# Patient Record
Sex: Male | Born: 1990 | Race: White | Hispanic: No | Marital: Married | Smoking: Former smoker
Health system: Southern US, Community
[De-identification: ages and names within clinical notes are randomized; demographics above are authoritative.]

---

## 2015-09-16 ENCOUNTER — Emergency Department
Admission: EM | Admit: 2015-09-16 | Discharge: 2015-09-16 | Disposition: A | Discharge status: Home / Self Care | Payer: Self-pay | Attending: Emergency Medicine | Admitting: Emergency Medicine

## 2015-09-16 ENCOUNTER — Encounter: Payer: Self-pay | Admitting: *Deleted

## 2015-09-16 DIAGNOSIS — Z87891 Personal history of nicotine dependence: Secondary | ICD-10-CM | POA: Insufficient documentation

## 2015-09-16 DIAGNOSIS — A880 Enteroviral exanthematous fever [Boston exanthem]: Secondary | ICD-10-CM | POA: Insufficient documentation

## 2015-09-16 DIAGNOSIS — H938X3 Other specified disorders of ear, bilateral: Secondary | ICD-10-CM | POA: Insufficient documentation

## 2015-09-16 DIAGNOSIS — R42 Dizziness and giddiness: Secondary | ICD-10-CM | POA: Insufficient documentation

## 2015-09-16 LAB — BASIC METABOLIC PANEL
ANION GAP: 6 (ref 5–15)
BUN: 19 mg/dL (ref 6–20)
CALCIUM: 9.3 mg/dL (ref 8.9–10.3)
CHLORIDE: 101 mmol/L (ref 101–111)
CO2: 31 mmol/L (ref 22–32)
CREATININE: 0.91 mg/dL (ref 0.61–1.24)
GFR calc Af Amer: >60 mL/min (ref >60)
GFR calc non Af Amer: >60 mL/min (ref >60)
GLUCOSE: 116 mg/dL — AB (ref 65–99)
Potassium: 4.4 mmol/L (ref 3.5–5.1)
Sodium: 138 mmol/L (ref 135–145)

## 2015-09-16 LAB — URINALYSIS COMPLETE WITH MICROSCOPIC (ARMC ONLY)
BILIRUBIN URINE: NEGATIVE
Bacteria, UA: NONE SEEN
Glucose, UA: NEGATIVE mg/dL
Hgb urine dipstick: NEGATIVE
Leukocytes, UA: NEGATIVE
Nitrite: NEGATIVE
PH: 5 (ref 5.0–8.0)
PROTEIN: NEGATIVE mg/dL
Specific Gravity, Urine: 1.03 (ref 1.005–1.030)

## 2015-09-16 LAB — CBC
HCT: 44.9 % (ref 40.0–52.0)
Hemoglobin: 14.6 g/dL (ref 13.0–18.0)
MCH: 28.2 pg (ref 26.0–34.0)
MCHC: 32.4 g/dL (ref 32.0–36.0)
MCV: 86.8 fL (ref 80.0–100.0)
PLATELETS: 236 10*3/uL (ref 150–440)
RBC: 5.17 MIL/uL (ref 4.40–5.90)
RDW: 13.7 % (ref 11.5–14.5)
WBC: 11 10*3/uL — ABNORMAL HIGH (ref 3.8–10.6)

## 2015-09-16 MED ORDER — MECLIZINE HCL 25 MG PO TABS
ORAL_TABLET | ORAL | Status: AC
  Filled 2015-09-16: qty 1

## 2015-09-16 MED ORDER — MECLIZINE HCL 25 MG PO TABS
25.0000 mg | ORAL_TABLET | Freq: Three times a day (TID) | ORAL | Status: AC | PRN
Start: 2015-09-16 — End: ?

## 2015-09-16 MED ORDER — ONDANSETRON 4 MG PO TBDP: 4.0000 mg | ORAL_TABLET | Freq: Three times a day (TID) | ORAL | Status: AC | PRN

## 2015-09-16 MED ORDER — MECLIZINE HCL 25 MG PO TABS
25.0000 mg | ORAL_TABLET | Freq: Once | ORAL | Status: AC
  Administered 2015-09-16: 25 mg via ORAL

## 2015-09-16 NOTE — ED Notes (Signed)
Pt states he is experiencing intermittent dizziness when lying on his back. Pt states sudden onset this morning. Pt states he feels as if the room is spinning. Pt states dizziness persists for less time and intensity when he is sitting and not dizzy at all when standing or walking. Pt denies n/v at this time. Pt neurologically intact w/o unilateral weakness.

## 2015-09-16 NOTE — Discharge Instructions (Signed)
1. You may take medicines as needed for dizziness and nausea (meclizine/Zofran #20). 2. Return to the ER for worsening symptoms, persistent vomiting, difficulty breathing or other concerns.  Dizziness Dizziness is a common problem. It is a feeling of unsteadiness or light-headedness. You may feel like you are about to faint. Dizziness can lead to injury if you stumble or fall. Anyone can become dizzy, but dizziness is more common in older adults. This condition can be caused by a number of things, including medicines, dehydration, or illness. HOME CARE INSTRUCTIONS Taking these steps may help with your condition: Eating and Drinking  Drink enough fluid to keep your urine clear or pale yellow. This helps to keep you from becoming dehydrated. Try to drink more clear fluids, such as water.  Do not drink alcohol.  Limit your caffeine intake if directed by your health care provider.  Limit your salt intake if directed by your health care provider. Activity  Avoid making quick movements.  Rise slowly from chairs and steady yourself until you feel okay.  In the morning, first sit up on the side of the bed. When you feel okay, stand slowly while you hold onto something until you know that your balance is fine.  Move your legs often if you need to stand in one place for a long time. Tighten and relax your muscles in your legs while you are standing.  Do not drive or operate heavy machinery if you feel dizzy.  Avoid bending down if you feel dizzy. Place items in your home so that they are easy for you to reach without leaning over. Lifestyle  Do not use any tobacco products, including cigarettes, chewing tobacco, or electronic cigarettes. If you need help quitting, ask your health care provider.  Try to reduce your stress level, such as with yoga or meditation. Talk with your health care provider if you need help. General Instructions  Watch your dizziness for any changes.  Take medicines  only as directed by your health care provider. Talk with your health care provider if you think that your dizziness is caused by a medicine that you are taking.  Tell a friend or a family member that you are feeling dizzy. If he or she notices any changes in your behavior, have this person call your health care provider.  Keep all follow-up visits as directed by your health care provider. This is important. SEEK MEDICAL CARE IF:  Your dizziness does not go away.  Your dizziness or light-headedness gets worse.  You feel nauseous.  You have reduced hearing.  You have new symptoms.  You are unsteady on your feet or you feel like the room is spinning. SEEK IMMEDIATE MEDICAL CARE IF:  You vomit or have diarrhea and are unable to eat or drink anything.  You have problems talking, walking, swallowing, or using your arms, hands, or legs.  You feel generally weak.  You are not thinking clearly or you have trouble forming sentences. It may take a friend or family member to notice this.  You have chest pain, abdominal pain, shortness of breath, or sweating.  Your vision changes.  You notice any bleeding.  You have a headache.  You have neck pain or a stiff neck.  You have a fever.   This information is not intended to replace advice given to you by your health care provider. Make sure you discuss any questions you have with your health care provider.   Document Released: 03/24/2001 Document Revised: 02/12/2015  Document Reviewed: 09/24/2014 °Elsevier Interactive Patient Education ©2016 Elsevier Inc. ° °

## 2015-09-16 NOTE — ED Provider Notes (Signed)
[facility] Emergency Department Provider Note  ____________________________________________  Time seen: Approximately 2:08 AM  I have reviewed the triage vital signs and the nursing notes.   HISTORY  Chief Complaint Dizziness    HPI Bary RichardJoshua Arquette is a 24 y.o. male who presents to the ED from home with a chief complaint of dizziness. Patient states he awoke with dizziness yesterday morning. Describes sensation as room spinning. Symptoms worse with head movement. Denies associated symptoms of nausea or vomiting. Denies headache, neck pain, fever, chills, chest pain, shortness of breath, abdominal pain, weakness. Presents tonight because he tried to lay flat to go to sleep and dizziness became worse.Denies recent travel or trauma.   Past medical history None   There are no active problems to display for this patient.   History reviewed. No pertinent past surgical history.  Current Outpatient Rx  Name  Route  Sig  Dispense  Refill  . meclizine (ANTIVERT) 25 MG tablet   Oral   Take 1 tablet (25 mg total) by mouth 3 (three) times daily as needed for dizziness or nausea.   20 tablet   0   . ondansetron (ZOFRAN ODT) 4 MG disintegrating tablet   Oral   Take 1 tablet (4 mg total) by mouth every 8 (eight) hours as needed for nausea or vomiting.   20 tablet   0     Allergies Amoxicillin and Penicillins  History reviewed. No pertinent family history.  Social History Social History  Substance Use Topics  . Smoking status: Former Smoker    Types: Cigarettes  . Smokeless tobacco: Never Used  . Alcohol Use: No    Review of Systems Constitutional: No fever/chills Eyes: No visual changes. ENT: No sore throat. Cardiovascular: Denies chest pain. Respiratory: Denies shortness of breath. Gastrointestinal: No abdominal pain.  No nausea, no vomiting.  No diarrhea.  No constipation. Genitourinary: Negative for dysuria. Musculoskeletal: Negative for  back pain. Skin: Negative for rash. Neurological: Positive for dizziness. Negative for headaches, focal weakness or numbness.  10-point ROS otherwise negative.  ____________________________________________   PHYSICAL EXAM:  VITAL SIGNS: ED Triage Vitals  Enc Vitals Group     BP 09/16/15 0045 132/68 mmHg     Pulse Rate 09/16/15 0045 74     Resp 09/16/15 0150 14     Temp 09/16/15 0045 97.5 F (36.4 C)     Temp Source 09/16/15 0045 Oral     SpO2 09/16/15 0045 99 %     Weight 09/16/15 0039 165 lb (74.844 kg)     Height 09/16/15 0039 6' (1.829 m)     Head Cir --      Peak Flow --      Pain Score --      Pain Loc --      Pain Edu? --      Excl. in GC? --     Constitutional: Alert and oriented. Well appearing and in no acute distress. Eyes: Conjunctivae are normal. PERRL. EOMI. Ears: Scant fluid behind bilateral TMs. Head: Atraumatic. Nose: No congestion/rhinnorhea. Mouth/Throat: Mucous membranes are moist.  Oropharynx non-erythematous. Neck: No stridor.  No carotid bruits. Cardiovascular: Normal rate, regular rhythm. Grossly normal heart sounds.  Good peripheral circulation. Respiratory: Normal respiratory effort.  No retractions. Lungs CTAB. Gastrointestinal: Soft and nontender. No distention. No abdominal bruits. No CVA tenderness. Musculoskeletal: No lower extremity tenderness nor edema.  No joint effusions. Neurologic:  Normal speech and language. No gross focal neurologic deficits are appreciated. No gait  instability. Skin:  Skin is warm, dry and intact. No rash noted. Psychiatric: Mood and affect are normal. Speech and behavior are normal.  ____________________________________________   LABS (all labs ordered are listed, but only abnormal results are displayed)  Labs Reviewed  BASIC METABOLIC PANEL - Abnormal; Notable for the following:    Glucose, Bld 116 (*)    All other components within normal limits  CBC - Abnormal; Notable for the following:    WBC 11.0  (*)    All other components within normal limits  URINALYSIS COMPLETEWITH MICROSCOPIC ([facility] ONLY) - Abnormal; Notable for the following:    Color, Urine YELLOW (*)    APPearance CLEAR (*)    Ketones, ur TRACE (*)    Squamous Epithelial / LPF 0-5 (*)    All other components within normal limits  CBG MONITORING, ED   ____________________________________________  EKG  ED ECG REPORT I, SUNG,JADE J, the attending physician, personally viewed and interpreted this ECG.   Date: 09/16/2015  EKG Time: 0036  Rate: 71  Rhythm: normal EKG, normal sinus rhythm  Axis: Normal  Intervals:none  ST&T Change: Nonspecific No change from prior  ____________________________________________  RADIOLOGY  None ____________________________________________   PROCEDURES  Procedure(s) performed: None  Critical Care performed: No  ____________________________________________   INITIAL IMPRESSION / ASSESSMENT AND PLAN / ED COURSE  Pertinent labs & imaging results that were available during my care of the patient were reviewed by me and considered in my medical decision making (see chart for details).  24 year old male who presents with dizziness consistent with vertigo. Meclizine given. Patient updated of laboratory results. He was feeling better after the meclizine. Strict return precautions given. Patient verbalizes understanding and agrees with plan of care. ____________________________________________   FINAL CLINICAL IMPRESSION(S) / ED DIAGNOSES  Final diagnoses:  Vertigo      Irean Hong, MD 09/16/15 [phone]

## 2015-09-16 NOTE — ED Notes (Signed)
Pt sleeping. 

## 2016-01-17 ENCOUNTER — Emergency Department
Admission: EM | Admit: 2016-01-17 | Discharge: 2016-01-17 | Disposition: A | Discharge status: Home / Self Care | Payer: No Typology Code available for payment source | Attending: Emergency Medicine | Admitting: Emergency Medicine

## 2016-01-17 ENCOUNTER — Encounter: Payer: Self-pay | Admitting: Emergency Medicine

## 2016-01-17 DIAGNOSIS — Z79899 Other long term (current) drug therapy: Secondary | ICD-10-CM | POA: Insufficient documentation

## 2016-01-17 DIAGNOSIS — Y998 Other external cause status: Secondary | ICD-10-CM | POA: Insufficient documentation

## 2016-01-17 DIAGNOSIS — S8002XA Contusion of left knee, initial encounter: Secondary | ICD-10-CM | POA: Diagnosis not present

## 2016-01-17 DIAGNOSIS — Y92410 Unspecified street and highway as the place of occurrence of the external cause: Secondary | ICD-10-CM | POA: Diagnosis not present

## 2016-01-17 DIAGNOSIS — Y9389 Activity, other specified: Secondary | ICD-10-CM | POA: Insufficient documentation

## 2016-01-17 DIAGNOSIS — Z87891 Personal history of nicotine dependence: Secondary | ICD-10-CM | POA: Insufficient documentation

## 2016-01-17 DIAGNOSIS — M25562 Pain in left knee: Secondary | ICD-10-CM | POA: Diagnosis present

## 2016-01-17 MED ORDER — CYCLOBENZAPRINE HCL 10 MG PO TABS: 10.0000 mg | ORAL_TABLET | Freq: Three times a day (TID) | ORAL | Status: AC | PRN

## 2016-01-17 MED ORDER — IBUPROFEN 800 MG PO TABS: 800.0000 mg | ORAL_TABLET | Freq: Three times a day (TID) | ORAL | Status: DC | PRN
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
## 2016-01-17 NOTE — Discharge Instructions (Signed)
Cryotherapy °Cryotherapy means treatment with cold. Ice or gel packs can be used to reduce both pain and swelling. Ice is the most helpful within the first 24 to 48 hours after an injury or flare-up from overusing a muscle or joint. Sprains, strains, spasms, burning pain, shooting pain, and aches can all be eased with ice. Ice can also be used when recovering from surgery. Ice is effective, has very few side effects, and is safe for most people to use. °PRECAUTIONS  °Ice is not a safe treatment option for people with: °· Raynaud phenomenon. This is a condition affecting small blood vessels in the extremities. Exposure to cold may cause your problems to return. °· Cold hypersensitivity. There are many forms of cold hypersensitivity, including: °· Cold urticaria. Red, itchy hives appear on the skin when the tissues begin to warm after being iced. °· Cold erythema. This is a red, itchy rash caused by exposure to cold. °· Cold hemoglobinuria. Red blood cells break down when the tissues begin to warm after being iced. The hemoglobin that carry oxygen are passed into the urine because they cannot combine with blood proteins fast enough. °· Numbness or altered sensitivity in the area being iced. °If you have any of the following conditions, do not use ice until you have discussed cryotherapy with your caregiver: °· Heart conditions, such as arrhythmia, angina, or chronic heart disease. °· High blood pressure. °· Healing wounds or open skin in the area being iced. °· Current infections. °· Rheumatoid arthritis. °· Poor circulation. °· Diabetes. °Ice slows the blood flow in the region it is applied. This is beneficial when trying to stop inflamed tissues from spreading irritating chemicals to surrounding tissues. However, if you expose your skin to cold temperatures for too long or without the proper protection, you can damage your skin or nerves. Watch for signs of skin damage due to cold. °HOME CARE INSTRUCTIONS °Follow  these tips to use ice and cold packs safely. °· Place a dry or damp towel between the ice and skin. A damp towel will cool the skin more quickly, so you may need to shorten the time that the ice is used. °· For a more rapid response, add gentle compression to the ice. °· Ice for no more than 10 to 20 minutes at a time. The bonier the area you are icing, the less time it will take to get the benefits of ice. °· Check your skin after 5 minutes to make sure there are no signs of a poor response to cold or skin damage. °· Rest 20 minutes or more between uses. °· Once your skin is numb, you can end your treatment. You can test numbness by very lightly touching your skin. The touch should be so light that you do not see the skin dimple from the pressure of your fingertip. When using ice, most people will feel these normal sensations in this order: cold, burning, aching, and numbness. °· Do not use ice on someone who cannot communicate their responses to pain, such as small children or people with dementia. °HOW TO MAKE AN ICE PACK °Ice packs are the most common way to use ice therapy. Other methods include ice massage, ice baths, and cryosprays. Muscle creams that cause a cold, tingly feeling do not offer the same benefits that ice offers and should not be used as a substitute unless recommended by your caregiver. °To make an ice pack, do one of the following: °· Place crushed ice or a   bag of frozen vegetables in a sealable plastic bag. Squeeze out the excess air. Place this bag inside another plastic bag. Slide the bag into a pillowcase or place a damp towel between your skin and the bag.  Mix 3 parts water with 1 part rubbing alcohol. Freeze the mixture in a sealable plastic bag. When you remove the mixture from the freezer, it will be slushy. Squeeze out the excess air. Place this bag inside another plastic bag. Slide the bag into a pillowcase or place a damp towel between your skin and the bag. SEEK MEDICAL CARE  IF:  You develop white spots on your skin. This may give the skin a blotchy (mottled) appearance.  Your skin turns blue or pale.  Your skin becomes waxy or hard.  Your swelling gets worse. MAKE SURE YOU:   Understand these instructions.  Will watch your condition.  Will get help right away if you are not doing well or get worse.   This information is not intended to replace advice given to you by your health care provider. Make sure you discuss any questions you have with your health care provider.   Document Released: 05/25/2011 Document Revised: 10/19/2014 Document Reviewed: 05/25/2011 Elsevier Interactive Patient Education 2016 [employer].  Technical brewer It is common to have multiple bruises and sore muscles after a motor vehicle collision (MVC). These tend to feel worse for the first 24 hours. You may have the most stiffness and soreness over the first several hours. You may also feel worse when you wake up the first morning after your collision. After this point, you will usually begin to improve with each day. The speed of improvement often depends on the severity of the collision, the number of injuries, and the location and nature of these injuries. HOME CARE INSTRUCTIONS  Put ice on the injured area.  Put ice in a plastic bag.  Place a towel between your skin and the bag.  Leave the ice on for 15-20 minutes, 3-4 times a day, or as directed by your health care provider.  Drink enough fluids to keep your urine clear or pale yellow. Do not drink alcohol.  Take a warm shower or bath once or twice a day. This will increase blood flow to sore muscles.  You may return to activities as directed by your caregiver. Be careful when lifting, as this may aggravate neck or back pain.  Only take over-the-counter or prescription medicines for pain, discomfort, or fever as directed by your caregiver. Do not use aspirin. This may increase bruising and bleeding. SEEK  IMMEDIATE MEDICAL CARE IF:  You have numbness, tingling, or weakness in the arms or legs.  You develop severe headaches not relieved with medicine.  You have severe neck pain, especially tenderness in the middle of the back of your neck.  You have changes in bowel or bladder control.  There is increasing pain in any area of the body.  You have shortness of breath, light-headedness, dizziness, or fainting.  You have chest pain.  You feel sick to your stomach (nauseous), throw up (vomit), or sweat.  You have increasing abdominal discomfort.  There is blood in your urine, stool, or vomit.  You have pain in your shoulder (shoulder strap areas).  You feel your symptoms are getting worse. MAKE SURE YOU:  Understand these instructions.  Will watch your condition.  Will get help right away if you are not doing well or get worse.   This information is  not intended to replace advice given to you by your health care provider. Make sure you discuss any questions you have with your health care provider.   Document Released: 09/28/2005 Document Revised: 10/19/2014 Document Reviewed: 02/25/2011 Elsevier Interactive Patient Education 2016 Elsevier Inc.  Contusion A contusion is a deep bruise. Contusions are the result of a blunt injury to tissues and muscle fibers under the skin. The injury causes bleeding under the skin. The skin overlying the contusion may turn blue, purple, or yellow. Minor injuries will give you a painless contusion, but more severe contusions may stay painful and swollen for a few weeks.  CAUSES  This condition is usually caused by a blow, trauma, or direct force to an area of the body. SYMPTOMS  Symptoms of this condition include:  Swelling of the injured area.  Pain and tenderness in the injured area.  Discoloration. The area may have redness and then turn blue, purple, or yellow. DIAGNOSIS  This condition is diagnosed based on a physical exam and medical  history. An X-ray, CT scan, or MRI may be needed to determine if there are any associated injuries, such as broken bones (fractures). TREATMENT  Specific treatment for this condition depends on what area of the body was injured. In general, the best treatment for a contusion is resting, icing, applying pressure to (compression), and elevating the injured area. This is often called the RICE strategy. Over-the-counter anti-inflammatory medicines may also be recommended for pain control.  HOME CARE INSTRUCTIONS   Rest the injured area.  If directed, apply ice to the injured area:  Put ice in a plastic bag.  Place a towel between your skin and the bag.  Leave the ice on for 20 minutes, 2-3 times per day.  If directed, apply light compression to the injured area using an elastic bandage. Make sure the bandage is not wrapped too tightly. Remove and reapply the bandage as directed by your health care provider.  If possible, raise (elevate) the injured area above the level of your heart while you are sitting or lying down.  Take over-the-counter and prescription medicines only as told by your health care provider. SEEK MEDICAL CARE IF:  Your symptoms do not improve after several days of treatment.  Your symptoms get worse.  You have difficulty moving the injured area. SEEK IMMEDIATE MEDICAL CARE IF:   You have severe pain.  You have numbness in a hand or foot.  Your hand or foot turns pale or cold.   This information is not intended to replace advice given to you by your health care provider. Make sure you discuss any questions you have with your health care provider.   Document Released: 07/08/2005 Document Revised: 06/19/2015 Document Reviewed: 02/13/2015 Elsevier Interactive Patient Education [employer] Else[employer] Inc.
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
## 2016-02-15 ENCOUNTER — Emergency Department
Admission: EM | Admit: 2016-02-15 | Discharge: 2016-02-15 | Disposition: A | Discharge status: Home / Self Care | Payer: No Typology Code available for payment source | Attending: Emergency Medicine | Admitting: Emergency Medicine

## 2016-02-15 ENCOUNTER — Emergency Department: Payer: No Typology Code available for payment source

## 2016-02-15 ENCOUNTER — Encounter: Payer: Self-pay | Admitting: Emergency Medicine

## 2016-02-15 DIAGNOSIS — Z87891 Personal history of nicotine dependence: Secondary | ICD-10-CM | POA: Insufficient documentation

## 2016-02-15 DIAGNOSIS — M79641 Pain in right hand: Secondary | ICD-10-CM | POA: Insufficient documentation
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
## 2016-02-15 NOTE — Discharge Instructions (Signed)
Please seek medical attention for any high fevers, chest pain, shortness of breath, change in behavior, persistent vomiting, bloody stool or any other new or concerning symptoms. ° ° ° °RICE for Routine Care of Injuries °The routine care of many injuries includes rest, ice, compression, and elevation (RICE therapy). RICE therapy is often recommended for injuries to soft tissues, such as a muscle strain, ligament injuries, bruises, and overuse injuries. It can also be used for some bony injuries. Using RICE therapy can help to relieve pain, lessen swelling, and enable your body to heal. °Rest °Rest is required to allow your body to heal. This usually involves reducing your normal activities and avoiding use of the injured part of your body. Generally, you can return to your normal activities when you are comfortable and have been given permission by your health care provider. °Ice °Icing your injury helps to keep the swelling down, and it lessens pain. Do not apply ice directly to your skin. °· Put ice in a plastic bag. °· Place a towel between your skin and the bag. °· Leave the ice on for 20 minutes, 2-3 times a day. °Do this for as long as you are directed by your health care provider. °Compression °Compression means putting pressure on the injured area. Compression helps to keep swelling down, gives support, and helps with discomfort. Compression may be done with an elastic bandage. If an elastic bandage has been applied, follow these general tips: °· Remove and reapply the bandage every 3-4 hours or as directed by your health care provider. °· Make sure the bandage is not wrapped too tightly, because this can cut off circulation. If part of your body beyond the bandage becomes blue, numb, cold, swollen, or more painful, your bandage is most likely too tight. If this occurs, remove your bandage and reapply it more loosely. °· See your health care provider if the bandage seems to be making your problems worse  rather than better. °Elevation °Elevation means keeping the injured area raised. This helps to lessen swelling and decrease pain. If possible, your injured area should be elevated at or above the level of your heart or the center of your chest. °WHEN SHOULD I SEEK MEDICAL CARE? °You should seek medical care if: °· Your pain and swelling continue. °· Your symptoms are getting worse rather than improving. °These symptoms may indicate that further evaluation or further X-rays are needed. Sometimes, X-rays may not show a small broken bone (fracture) until a number of days later. Make a follow-up appointment with your health care provider. °WHEN SHOULD I SEEK IMMEDIATE MEDICAL CARE? °You should seek immediate medical care if: °· You have sudden severe pain at or below the area of your injury. °· You have redness or increased swelling around your injury. °· You have tingling or numbness at or below the area of your injury that does not improve after you remove the elastic bandage. °  °This information is not intended to replace advice given to you by your health care provider. Make sure you discuss any questions you have with your health care provider. °  °Document Released: 01/10/2001 Document Revised: 06/19/2015 Document Reviewed: 09/05/2014 °Elsevier Interactive Patient Education ©2016 [employer] ° °

## 2017-03-24 ENCOUNTER — Encounter: Payer: Self-pay | Admitting: *Deleted

## 2017-03-24 ENCOUNTER — Emergency Department
Admission: EM | Admit: 2017-03-24 | Discharge: 2017-03-24 | Disposition: A | Discharge status: Home / Self Care | Payer: Self-pay | Attending: Emergency Medicine | Admitting: Emergency Medicine

## 2017-03-24 ENCOUNTER — Emergency Department: Payer: Self-pay

## 2017-03-24 DIAGNOSIS — M25572 Pain in left ankle and joints of left foot: Secondary | ICD-10-CM

## 2017-03-24 DIAGNOSIS — Y999 Unspecified external cause status: Secondary | ICD-10-CM | POA: Insufficient documentation

## 2017-03-24 DIAGNOSIS — S93402A Sprain of unspecified ligament of left ankle, initial encounter: Secondary | ICD-10-CM | POA: Insufficient documentation

## 2017-03-24 DIAGNOSIS — Z87891 Personal history of nicotine dependence: Secondary | ICD-10-CM | POA: Insufficient documentation

## 2017-03-24 DIAGNOSIS — Y9372 Activity, wrestling: Secondary | ICD-10-CM | POA: Insufficient documentation

## 2017-03-24 DIAGNOSIS — Y929 Unspecified place or not applicable: Secondary | ICD-10-CM | POA: Insufficient documentation

## 2017-03-24 DIAGNOSIS — X509XXA Other and unspecified overexertion or strenuous movements or postures, initial encounter: Secondary | ICD-10-CM | POA: Insufficient documentation

## 2017-03-24 MED ORDER — IBUPROFEN 800 MG PO TABS
ORAL_TABLET | ORAL | Status: AC
  Filled 2017-03-24: qty 1

## 2017-03-24 MED ORDER — IBUPROFEN 800 MG PO TABS: 800.0000 mg | ORAL_TABLET | Freq: Three times a day (TID) | ORAL | 0 refills | Status: AC | PRN

## 2017-03-24 MED ORDER — IBUPROFEN 800 MG PO TABS
800.0000 mg | ORAL_TABLET | Freq: Once | ORAL | Status: AC
  Administered 2017-03-24: 800 mg via ORAL

## 2017-03-24 NOTE — ED Triage Notes (Signed)
Pt to triage via wheelchair.  Pt has left ankle pain.  Pt injured ankle today while wrestling.  States painful to ambulate.  Pt has swelling and bruising.  Pt alert.

## 2017-03-24 NOTE — ED Provider Notes (Signed)
[facility] Emergency Department Provider Note   ____________________________________________   First MD Initiated Contact with Patient 03/24/17 [phone]     (approximate)  I have reviewed the triage vital signs and the nursing notes.   HISTORY  Chief Complaint Ankle Pain    HPI Souleymane Saiki is a 26 y.o. male who presents to the ED from work with a chief complaint of left ankle pain. States he was wrestling earlier and fell onto his left ankle. Painful to ambulate. 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 Complains of pain and swelling to his left ankle without associated extremity weakness, numbness or tingling. Denies other injuries and voices no other medical complaints.   Past medical history None  There are no active problems to display for this patient.   No past surgical history on file.  Prior to Admission medications   Medication Sig Start Date End Date Taking? Authorizing Provider  cyclobenzaprine (FLEXERIL) 10 MG tablet Take 1 tablet (10 mg total) by mouth every 8 (eight) hours as needed for muscle spasms. 01/17/16   Beers, Charmayne Sheer, PA-C  ibuprofen (ADVIL,MOTRIN) 800 MG tablet Take 1 tablet (800 mg total) by mouth every 8 (eight) hours as needed for moderate pain. 03/24/17   Irean Hong, MD  meclizine (ANTIVERT) 25 MG tablet Take 1 tablet (25 mg total) by mouth 3 (three) times daily as needed for dizziness or nausea. 09/16/15   Irean Hong, MD  ondansetron (ZOFRAN ODT) 4 MG disintegrating tablet Take 1 tablet (4 mg total) by mouth every 8 (eight) hours as needed for nausea or vomiting. 09/16/15   Irean Hong, MD    Allergies Amoxicillin and Penicillins  No family history on file.  Social History Social History  Substance Use Topics  . Smoking status: Former Smoker    Types: Cigarettes  . Smokeless tobacco: Never Used  . Alcohol use No    Review of Systems  Constitutional:  No fever/chills. Eyes: No visual changes. ENT: No sore throat. Cardiovascular: Denies chest pain. Respiratory: Denies shortness of breath. Gastrointestinal: No abdominal pain.  No nausea, no vomiting.  No diarrhea.  No constipation. Genitourinary: Negative for dysuria. Musculoskeletal: Positive for left ankle pain. Negative for back pain. Skin: Negative for rash. Neurological: Negative for headaches, focal weakness or numbness.   ____________________________________________   PHYSICAL EXAM:  VITAL SIGNS: ED Triage Vitals  Enc Vitals Group     BP 03/24/17 0244 139/73     Pulse Rate 03/24/17 0244 85     Resp 03/24/17 0244 16     Temp 03/24/17 0244 98.4 F (36.9 C)     Temp Source 03/24/17 0244 Oral     SpO2 03/24/17 0244 99 %     Weight 03/24/17 0242 183 lb (83 kg)     Height 03/24/17 0242 5\' 11"  (1.803 m)     Head Circumference --      Peak Flow --      Pain Score 03/24/17 0242 0     Pain Loc --      Pain Edu? --      Excl. in GC? --     Constitutional: Sleeping, awakened for exam. Alert and oriented. Well appearing and in no acute distress. Eyes: Conjunctivae are normal. PERRL. EOMI. Head: Atraumatic. Nose: No congestion/rhinnorhea. Mouth/Throat: Mucous membranes are moist.  Oropharynx non-erythematous. Neck: No stridor.  No cervical spine tenderness to palpation. Cardiovascular: Normal rate,  regular rhythm. Grossly normal heart sounds.  Good peripheral circulation. Respiratory: Normal respiratory effort.  No retractions. Lungs CTAB. Gastrointestinal: Soft and nontender. No distention. No abdominal bruits. No CVA tenderness. Musculoskeletal: Left lateral malleolus with mild swelling. No bruising or deformity. Limited range of motion secondary to pain. 2+ distal pulses. Brisk, less than 5 second capillary refill.  Neurologic:  Normal speech and language. No gross focal neurologic deficits are appreciated.  Skin:  Skin is warm, dry and intact. No rash  noted. Psychiatric: Mood and affect are normal. Speech and behavior are normal.  ____________________________________________   LABS (all labs ordered are listed, but only abnormal results are displayed)  Labs Reviewed - No data to display ____________________________________________  EKG  None ____________________________________________  RADIOLOGY  Dg Ankle Complete Left  Result Date: 03/24/2017 CLINICAL DATA:  Acute onset of left ankle pain after injury while wrestling. Initial encounter. EXAM: LEFT ANKLE COMPLETE - 3+ VIEW COMPARISON:  None. FINDINGS: There is no evidence of fracture or dislocation. The ankle mortise is intact; the interosseous space is within normal limits. No talar tilt or subluxation is seen. The joint spaces are preserved. Lateral soft tissue swelling is noted. IMPRESSION: No evidence of fracture or dislocation. Electronically Signed   By: Roanna RaiderJeffery  Chang M.D.   On: 03/24/2017 03:42    ____________________________________________   PROCEDURES  Procedure(s) performed: None  Procedures  Critical Care performed: No  ____________________________________________   INITIAL IMPRESSION / ASSESSMENT AND PLAN / ED COURSE  Pertinent labs & imaging results that were available during my care of the patient were reviewed by me and considered in my medical decision making (see chart for details).  26 year old male who presents with left ankle sprain. Will place in ankle stirrup splint, crutches, NSAIDs and orthopedics follow-up. Strict return precautions given. Patient verbalizes understanding and agrees with plan of care.      ____________________________________________   FINAL CLINICAL IMPRESSION(S) / ED DIAGNOSES  Final diagnoses:  Sprain of left ankle, unspecified ligament, initial encounter  Acute left ankle pain      NEW MEDICATIONS STARTED DURING THIS VISIT:  New Prescriptions   IBUPROFEN (ADVIL,MOTRIN) 800 MG TABLET    Take 1 tablet (800  mg total) by mouth every 8 (eight) hours as needed for moderate pain.     Note:  This document was prepared using Dragon voice recognition software and may include unintentional dictation errors.    Irean HongSung, Alwaleed Obeso J, MD 03/24/17 [phone]

## 2017-03-24 NOTE — Discharge Instructions (Signed)
1. Take pain medicine as needed (Motrin #15). 2. Wear ankle splint & use crutches as needed. 3. Elevate affected area & apply ice over splint several times daily. 4. Return to the ER for worsening symptoms, increased swelling, numbness/tingling or other concerns.

## 2017-04-13 ENCOUNTER — Encounter: Payer: Self-pay | Admitting: Emergency Medicine

## 2017-04-13 DIAGNOSIS — Z23 Encounter for immunization: Secondary | ICD-10-CM | POA: Insufficient documentation

## 2017-04-13 DIAGNOSIS — Z79899 Other long term (current) drug therapy: Secondary | ICD-10-CM | POA: Insufficient documentation

## 2017-04-13 DIAGNOSIS — Y929 Unspecified place or not applicable: Secondary | ICD-10-CM | POA: Insufficient documentation

## 2017-04-13 DIAGNOSIS — Y9389 Activity, other specified: Secondary | ICD-10-CM | POA: Insufficient documentation

## 2017-04-13 DIAGNOSIS — Y990 Civilian activity done for income or pay: Secondary | ICD-10-CM | POA: Insufficient documentation

## 2017-04-13 DIAGNOSIS — Z87891 Personal history of nicotine dependence: Secondary | ICD-10-CM | POA: Insufficient documentation

## 2017-04-13 DIAGNOSIS — S61012A Laceration without foreign body of left thumb without damage to nail, initial encounter: Secondary | ICD-10-CM | POA: Insufficient documentation

## 2017-04-13 DIAGNOSIS — W268XXA Contact with other sharp object(s), not elsewhere classified, initial encounter: Secondary | ICD-10-CM | POA: Insufficient documentation

## 2017-04-13 NOTE — ED Triage Notes (Signed)
Patient to ER for c/o lac to left thumb. States blood was squirting at time of incident. Patient states he was at work and cut it with a box cutter earlier today. Has approx 2 inch semi-circular lac to side of left thumb. Bleeding controlled.

## 2017-04-14 ENCOUNTER — Emergency Department
Admission: EM | Admit: 2017-04-14 | Discharge: 2017-04-14 | Disposition: A | Discharge status: Home / Self Care | Payer: Self-pay | Attending: Emergency Medicine | Admitting: Emergency Medicine

## 2017-04-14 DIAGNOSIS — S61012A Laceration without foreign body of left thumb without damage to nail, initial encounter: Secondary | ICD-10-CM

## 2017-04-14 MED ORDER — LIDOCAINE HCL (PF) 1 % IJ SOLN
INTRAMUSCULAR | Status: AC
  Filled 2017-04-14: qty 5

## 2017-04-14 MED ORDER — CEPHALEXIN 500 MG PO CAPS
ORAL_CAPSULE | ORAL | Status: AC
  Filled 2017-04-14: qty 1

## 2017-04-14 MED ORDER — CEPHALEXIN 500 MG PO CAPS
500.0000 mg | ORAL_CAPSULE | Freq: Once | ORAL | Status: AC
  Administered 2017-04-14: 500 mg via ORAL

## 2017-04-14 MED ORDER — CEPHALEXIN 500 MG PO CAPS
500.0000 mg | ORAL_CAPSULE | Freq: Two times a day (BID) | ORAL | 0 refills | Status: AC
Start: 2017-04-14 — End: 2017-04-24

## 2017-04-14 MED ORDER — TETANUS-DIPHTH-ACELL PERTUSSIS 5-2.5-18.5 LF-MCG/0.5 IM SUSP
INTRAMUSCULAR | Status: AC
  Administered 2017-04-14: 0.5 mL via INTRAMUSCULAR
  Filled 2017-04-14: qty 0.5

## 2017-04-14 MED ORDER — TETANUS-DIPHTHERIA TOXOIDS TD 5-2 LFU IM INJ: 0.5000 mL | INJECTION | Freq: Once | INTRAMUSCULAR | Status: DC

## 2017-04-14 MED ORDER — IBUPROFEN 800 MG PO TABS
ORAL_TABLET | ORAL | Status: AC
  Filled 2017-04-14: qty 1

## 2017-04-14 MED ORDER — TETANUS-DIPHTH-ACELL PERTUSSIS 5-2.5-18.5 LF-MCG/0.5 IM SUSP
0.5000 mL | Freq: Once | INTRAMUSCULAR | Status: AC
  Administered 2017-04-14: 0.5 mL via INTRAMUSCULAR

## 2017-04-14 MED ORDER — IBUPROFEN 800 MG PO TABS
800.0000 mg | ORAL_TABLET | Freq: Once | ORAL | Status: AC
  Administered 2017-04-14: 800 mg via ORAL

## 2017-04-14 NOTE — ED Provider Notes (Signed)
[facility] Emergency Department Provider Note    First MD Initiated Contact with Patient 04/14/17 0128     (approximate)  I have reviewed the triage vital signs and the nursing notes.   HISTORY  Chief Complaint Laceration    HPI Richard Dickson is a 26 y.o. male presents to emergency department with accidental laceration to the left thumb which occurred while the patient was at work at 3 PM yesterday afternoon. Patient stated was considerable amount of bleeding from the wound that he describes as "squirting". Patient sitting wound was sustained secondary to a box cutter. Patient does not know his last tetanus vaccine administration.   Past Medical History None  There are no active problems to display for this patient.  Past surgical history None  Prior to Admission medications   Medication Sig Start Date End Date Taking? Authorizing Provider  cyclobenzaprine (FLEXERIL) 10 MG tablet Take 1 tablet (10 mg total) by mouth every 8 (eight) hours as needed for muscle spasms. 01/17/16   Beers, Charmayne Sheerharles M, PA-C  ibuprofen (ADVIL,MOTRIN) 800 MG tablet Take 1 tablet (800 mg total) by mouth every 8 (eight) hours as needed for moderate pain. 03/24/17   Irean HongSung, Jade J, MD  meclizine (ANTIVERT) 25 MG tablet Take 1 tablet (25 mg total) by mouth 3 (three) times daily as needed for dizziness or nausea. 09/16/15   Irean HongSung, Jade J, MD  ondansetron (ZOFRAN ODT) 4 MG disintegrating tablet Take 1 tablet (4 mg total) by mouth every 8 (eight) hours as needed for nausea or vomiting. 09/16/15   Irean HongSung, Jade J, MD    Allergies Amoxicillin and Penicillins  No family history on file.  Social History Social History  Substance Use Topics  . Smoking status: Former Smoker    Types: Cigarettes  . Smokeless tobacco: Never Used  . Alcohol use No    Review of Systems Constitutional: No fever/chills Eyes: No visual changes. ENT: No sore throat. Cardiovascular: Denies chest  pain. Respiratory: Denies shortness of breath. Gastrointestinal: No abdominal pain.  No nausea, no vomiting.  No diarrhea.  No constipation. Genitourinary: Negative for dysuria. Musculoskeletal: Negative for neck pain.  Negative for back pain. Integumentary: Negative for rash.Positive for left thumb laceration Neurological: Negative for headaches, focal weakness or numbness.  ____________________________________________   PHYSICAL EXAM:  VITAL SIGNS: ED Triage Vitals  Enc Vitals Group     BP 04/13/17 2252 (!) 142/76     Pulse Rate 04/13/17 2252 63     Resp 04/13/17 2252 18     Temp 04/13/17 2252 98.7 F (37.1 C)     Temp Source 04/13/17 2252 Oral     SpO2 04/13/17 2252 99 %     Weight 04/13/17 2253 83 kg (183 lb)     Height 04/13/17 2253 1.803 m (5\' 11" )     Head Circumference --      Peak Flow --      Pain Score 04/13/17 2252 3     Pain Loc --      Pain Edu? --      Excl. in GC? --     Constitutional: Alert and oriented. Well appearing and in no acute distress. Eyes: Conjunctivae are normal.  Head: Atraumatic. Mouth/Throat: Mucous membranes are moist. Oropharynx non-erythematous. Neck: No stridor.   Cardiovascular: Normal rate, regular rhythm. Good peripheral circulation. Grossly normal heart sounds. Respiratory: Normal respiratory effort.  No retractions. Lungs CTAB. Gastrointestinal: Soft and nontender. No distention.  Musculoskeletal: No lower extremity tenderness  nor edema. No gross deformities of extremities. Neurologic:  Normal speech and language. No gross focal neurologic deficits are appreciated.  Skin:  2 cm C-shaped laceration on the medial aspect of the left thumb. Psychiatric: Mood and affect are normal. Speech and behavior are normal.    ..Laceration Repair Date/Time: 04/15/2017 6:20 AM Performed by: Darci Current Authorized by: Darci Current   Consent:    Consent obtained:  Verbal   Consent given by:  Patient   Risks discussed:  Infection,  pain and poor cosmetic result   Alternatives discussed:  No treatment Anesthesia (see MAR for exact dosages):    Anesthesia method:  Local infiltration   Local anesthetic:  Lidocaine 1% w/o epi Laceration details:    Location:  Finger   Finger location:  L thumb   Length (cm):  2   Depth (mm):  5 Repair type:    Repair type:  Simple Pre-procedure details:    Preparation:  Patient was prepped and draped in usual sterile fashion Exploration:    Contaminated: no   Treatment:    Area cleansed with:  Betadine and saline   Amount of cleaning:  Standard   Visualized foreign bodies/material removed: no   Skin repair:    Repair method:  Sutures   Suture size:  5-0   Suture material:  Nylon   Number of sutures:  5 Approximation:    Approximation:  Close Post-procedure details:    Dressing:  Antibiotic ointment   Patient tolerance of procedure:  Tolerated well, no immediate complications     ____________________________________________   INITIAL IMPRESSION / ASSESSMENT AND PLAN / ED COURSE  Pertinent labs & imaging results that were available during my care of the patient were reviewed by me and considered in my medical decision making (see chart for details).  Spoke with the patient length regarding the possibility of this wound become infected secondary to delayed closure. Patient given Keflex prophylactically. Patient advised warning signs of infection and instructed to return to emergency department this were to ensue.      ____________________________________________  FINAL CLINICAL IMPRESSION(S) / ED DIAGNOSES  Final diagnoses:  Laceration of left thumb without foreign body without damage to nail, initial encounter     MEDICATIONS GIVEN DURING THIS VISIT:  Medications  lidocaine (PF) (XYLOCAINE) 1 % injection (not administered)     NEW OUTPATIENT MEDICATIONS STARTED DURING THIS VISIT:  New Prescriptions   No medications on file    Modified Medications    No medications on file    Discontinued Medications   No medications on file     Note:  This document was prepared using Dragon voice recognition software and may include unintentional dictation errors.    Darci Current, MD 04/15/17 [phone]

## 2017-04-14 NOTE — ED Notes (Signed)
Patient tolerated suture repair well. Will continue to monitor.

## 2017-04-14 NOTE — ED Notes (Signed)
Patient to ED for left thumb laceration. 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 Attempted to cut the flap off the window before installation and the box cutter hit his left thumb and "went all the way around." Patient needed the hours so he wrapped it up and kept on working. [place] and then decided to come here and get it taken care of. Dressing in place from triage. Will await MD before removal in the event it continues to bleed. Patient stated at the time, it was "gushing blood all over the place." Patient alert and oriented x 4.

## 2017-04-14 NOTE — ED Notes (Signed)
Patient does not want to file worker's comp.   

## 2017-04-22 ENCOUNTER — Emergency Department
Admission: EM | Admit: 2017-04-22 | Discharge: 2017-04-22 | Disposition: A | Discharge status: Home / Self Care | Payer: Self-pay | Attending: Emergency Medicine | Admitting: Emergency Medicine

## 2017-04-22 ENCOUNTER — Encounter: Payer: Self-pay | Admitting: Emergency Medicine

## 2017-04-22 DIAGNOSIS — Z4802 Encounter for removal of sutures: Secondary | ICD-10-CM | POA: Insufficient documentation

## 2017-04-22 NOTE — ED Provider Notes (Signed)
[facility] Emergency Department Provider Note   ____________________________________________   First MD Initiated Contact with Patient 04/22/17 1136     (approximate)  I have reviewed the triage vital signs and the nursing notes.   HISTORY  Chief Complaint Suture / Staple Removal    HPI Richard Dickson is a 26 y.o. male patient presents today for suture removal secondary to laceration left thumb 9 days ago. Patient denies any complaints.   History reviewed. No pertinent past medical history.  There are no active problems to display for this patient.   History reviewed. No pertinent surgical history.  Prior to Admission medications   Medication Sig Start Date End Date Taking? Authorizing Provider  cephALEXin (KEFLEX) 500 MG capsule Take 1 capsule (500 mg total) by mouth 2 (two) times daily. 04/14/17 04/24/17  Darci CurrentBrown, [place] N, MD  cyclobenzaprine (FLEXERIL) 10 MG tablet Take 1 tablet (10 mg total) by mouth every 8 (eight) hours as needed for muscle spasms. 01/17/16   Beers, Charmayne Sheerharles M, PA-C  ibuprofen (ADVIL,MOTRIN) 800 MG tablet Take 1 tablet (800 mg total) by mouth every 8 (eight) hours as needed for moderate pain. 03/24/17   Irean HongSung, Jade J, MD  meclizine (ANTIVERT) 25 MG tablet Take 1 tablet (25 mg total) by mouth 3 (three) times daily as needed for dizziness or nausea. 09/16/15   Irean HongSung, Jade J, MD  ondansetron (ZOFRAN ODT) 4 MG disintegrating tablet Take 1 tablet (4 mg total) by mouth every 8 (eight) hours as needed for nausea or vomiting. 09/16/15   Irean HongSung, Jade J, MD    Allergies Amoxicillin and Penicillins  No family history on file.  Social History Social History  Substance Use Topics  . Smoking status: Former Smoker    Types: Cigarettes  . Smokeless tobacco: Never Used  . Alcohol use No    Review of Systems  Constitutional: No fever/chills Eyes: No visual changes. ENT: No sore throat. Cardiovascular: Denies chest pain. Respiratory:  Denies shortness of breath. Gastrointestinal: No abdominal pain.  No nausea, no vomiting.  No diarrhea.  No constipation. Genitourinary: Negative for dysuria. Musculoskeletal: Negative for back pain. Skin: Negative for rash. Left thumb laceration Neurological: Negative for headaches, focal weakness or numbness.   ____________________________________________   PHYSICAL EXAM:  VITAL SIGNS: ED Triage Vitals [04/22/17 1135]  Enc Vitals Group     BP      Pulse      Resp      Temp      Temp src      SpO2      Weight 183 lb (83 kg)     Height 5\' 11"  (1.803 m)     Head Circumference      Peak Flow      Pain Score      Pain Loc      Pain Edu?      Excl. in GC?     Constitutional: Alert and oriented. Well appearing and in no acute distress. Cardiovascular: Normal rate, regular rhythm. Grossly normal heart sounds.  Good peripheral circulation. Respiratory: Normal respiratory effort.  No retractions. Lungs CTAB. Neurologic:  Normal speech and language. No gross focal neurologic deficits are appreciated. No gait instability. Skin:  Skin is warm, dry and intact. No rash noted. Healed laceration palmar aspect of left thumb. Psychiatric: Mood and affect are normal. Speech and behavior are normal.  ____________________________________________   LABS (all labs ordered are listed, but only abnormal results are displayed)  Labs Reviewed -  No data to display ____________________________________________  EKG   ____________________________________________  RADIOLOGY  No results found.  ____________________________________________   PROCEDURES  Procedure(s) performed: None  Procedures  Critical Care performed: No  ____________________________________________   INITIAL IMPRESSION / ASSESSMENT AND PLAN / ED COURSE  Pertinent labs & imaging results that were available during my care of the patient were reviewed by me and considered in my medical decision making (see chart  for details).  Healed laceration palmar aspect of left thumb. Sutures were removed and area was bandaged. Patient given discharge Instructions.      ____________________________________________   FINAL CLINICAL IMPRESSION(S) / ED DIAGNOSES  Final diagnoses:  Visit for suture removal      NEW MEDICATIONS STARTED DURING THIS VISIT:  New Prescriptions   No medications on file     Note:  This document was prepared using Dragon voice recognition software and may include unintentional dictation errors.    Joni Reining, PA-C 04/22/17 1140    Joni Reining, PA-C 04/22/17 1149    Minna Antis, MD 04/22/17 [phone]

## 2017-04-22 NOTE — ED Triage Notes (Signed)
states he is here for suture removal to thumb  Sutures intact no redness ntoed

## 2017-07-30 IMAGING — DX DG ANKLE COMPLETE 3+V*L*
3 series · 3 of 3 positions shown · non-contrast
Comparison: None.

CLINICAL DATA: Acute onset of left ankle pain after injury while
wrestling. Initial encounter.

EXAM:
LEFT ANKLE COMPLETE - 3+ VIEW

[ankle ap]
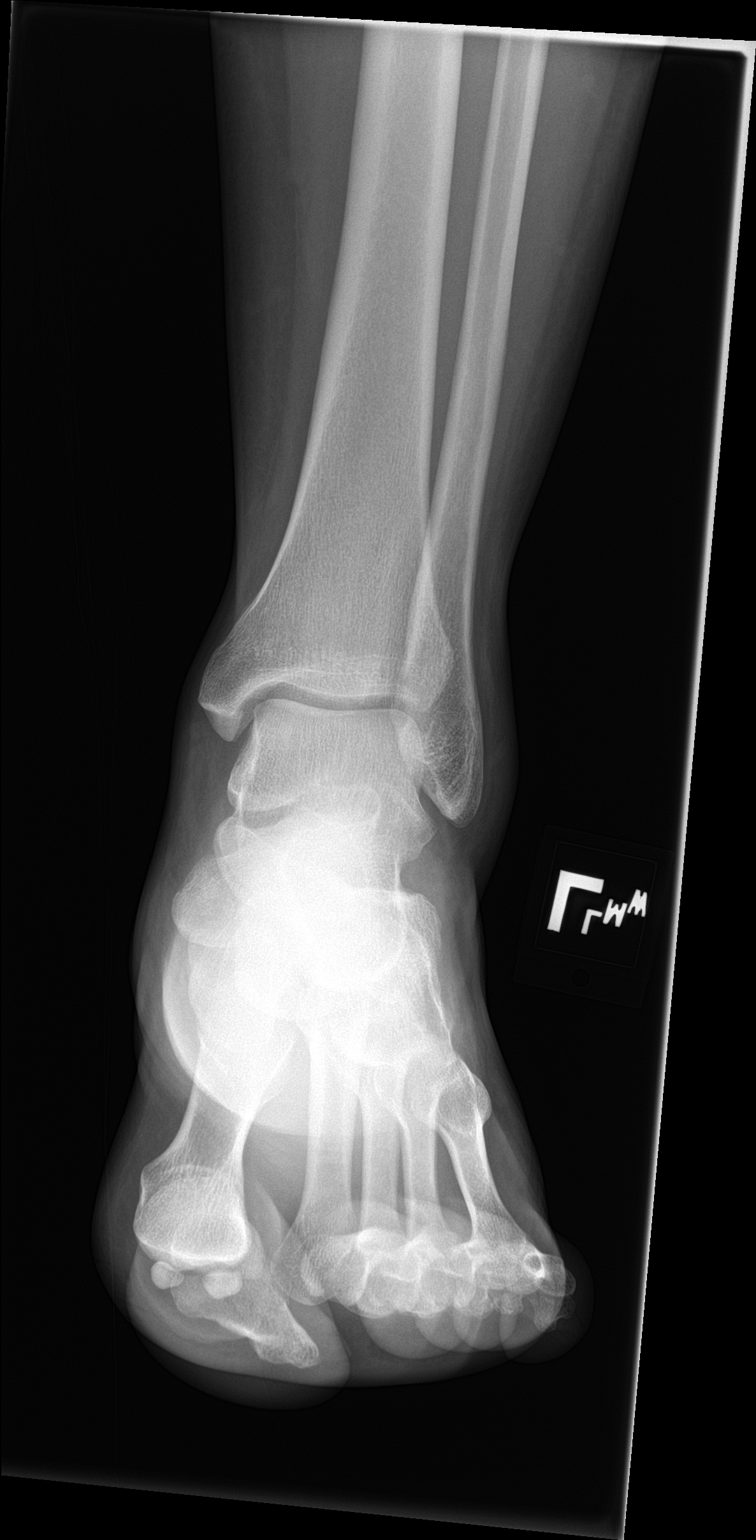

[ankle obl]
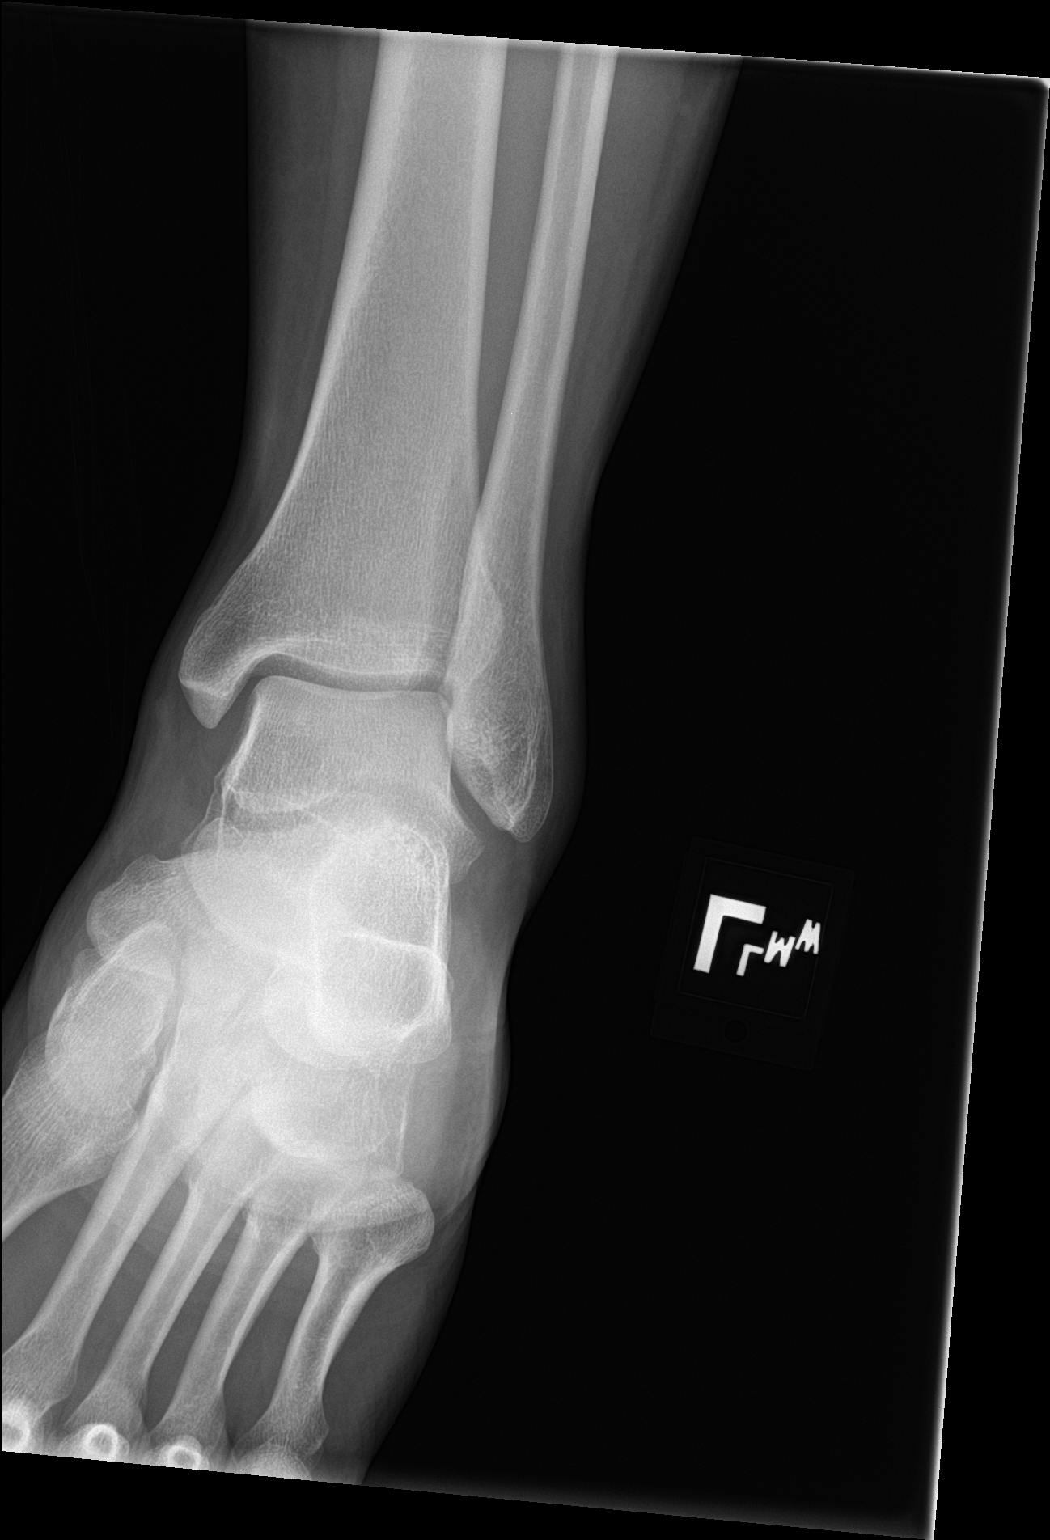

[ankle lat]
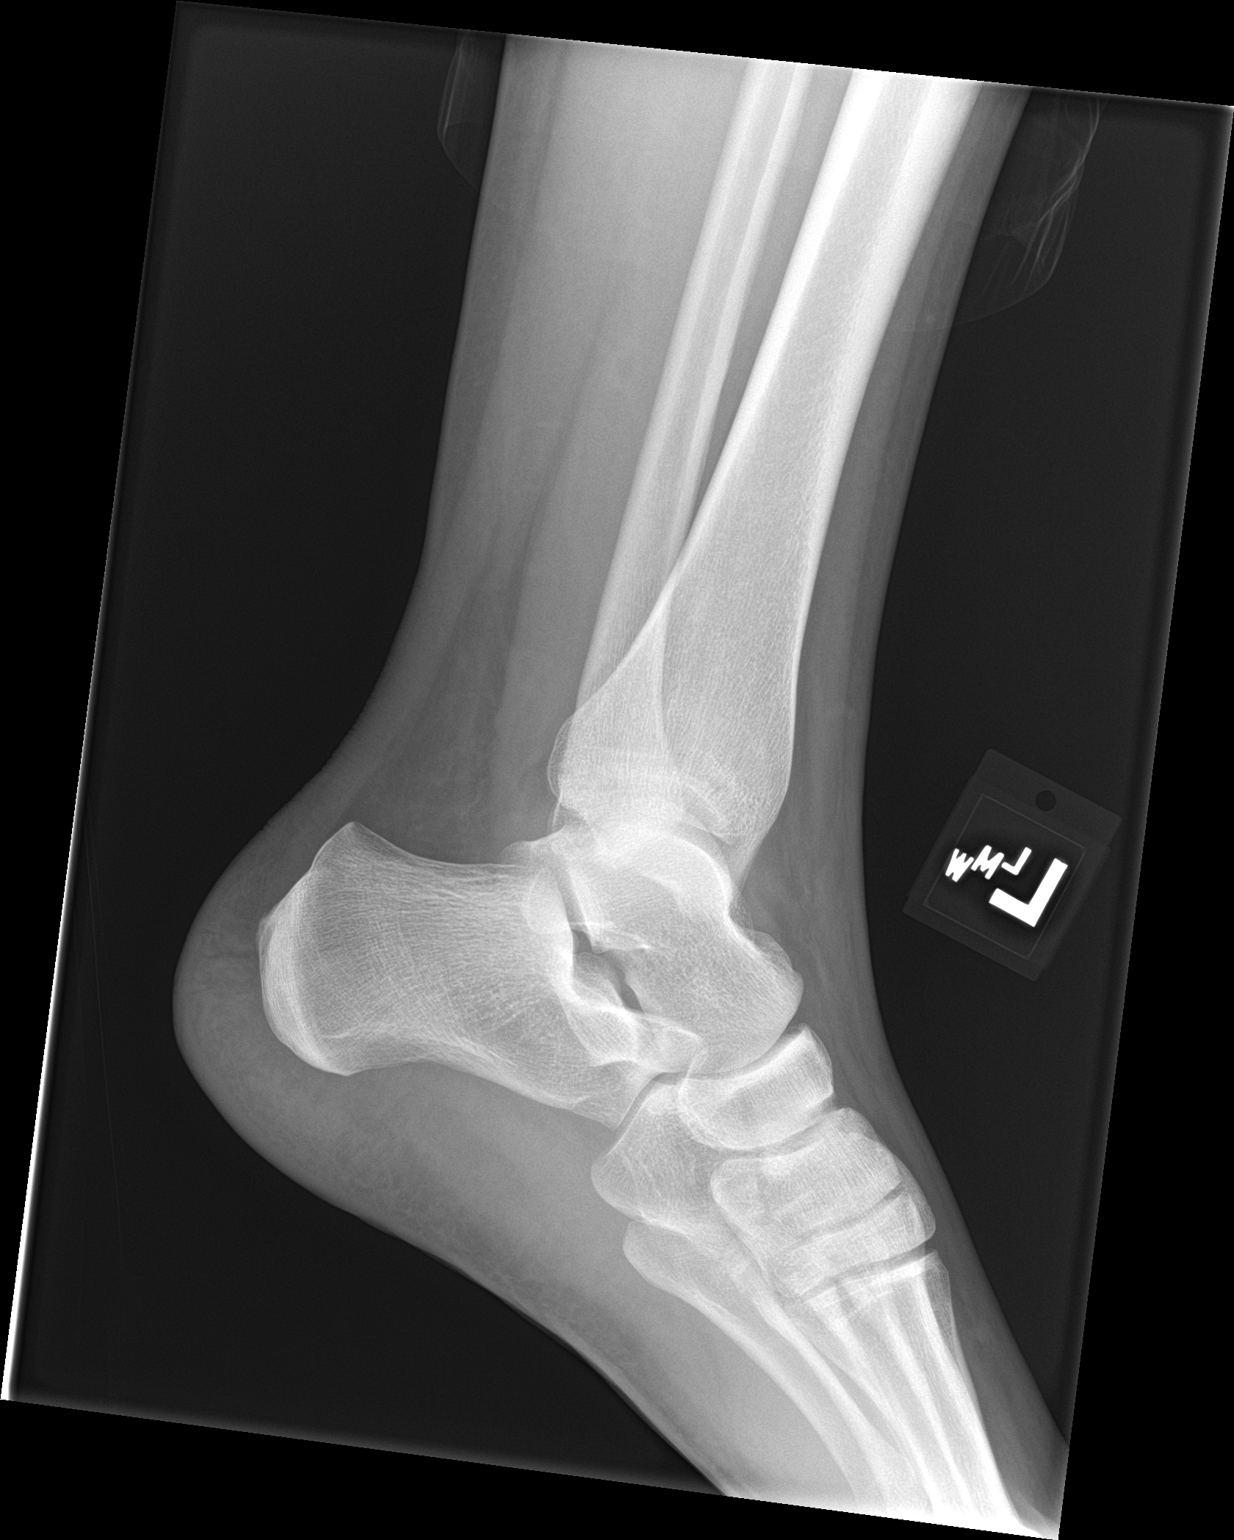

[3 of 3 positions shown; findings below may reference images not displayed]

FINDINGS: There is no evidence of fracture or dislocation. The ankle mortise
is intact; the interosseous space is within normal limits. No talar
tilt or subluxation is seen.

The joint spaces are preserved. Lateral soft tissue swelling is
noted.
IMPRESSION: No evidence of fracture or dislocation.

## 2018-07-29 ENCOUNTER — Other Ambulatory Visit: Payer: Self-pay

## 2018-07-29 ENCOUNTER — Encounter (HOSPITAL_COMMUNITY): Payer: Self-pay

## 2018-07-29 ENCOUNTER — Emergency Department (HOSPITAL_COMMUNITY)
Admission: EM | Admit: 2018-07-29 | Discharge: 2018-07-29 | Disposition: A | Discharge status: Home / Self Care | Payer: Self-pay | Attending: Emergency Medicine | Admitting: Emergency Medicine

## 2018-07-29 DIAGNOSIS — K0889 Other specified disorders of teeth and supporting structures: Secondary | ICD-10-CM

## 2018-07-29 DIAGNOSIS — Z87891 Personal history of nicotine dependence: Secondary | ICD-10-CM | POA: Insufficient documentation

## 2018-07-29 DIAGNOSIS — Z79899 Other long term (current) drug therapy: Secondary | ICD-10-CM | POA: Insufficient documentation

## 2018-07-29 MED ORDER — CLINDAMYCIN HCL 150 MG PO CAPS: 450.0000 mg | ORAL_CAPSULE | Freq: Three times a day (TID) | ORAL | 0 refills | Status: AC

## 2018-07-29 NOTE — ED Triage Notes (Signed)
Pt reports right side dental pain X1 month. States using OTC tylenol and ibuprofen with minimal relief. Minimal swelling noted.

## 2018-07-29 NOTE — ED Provider Notes (Signed)
[facility] EMERGENCY DEPARTMENT Provider Note   CSN: [id number] Arrival date & time: 07/29/18  0911     History   Chief Complaint Chief Complaint  Patient presents with  . Dental Pain    HPI Richard Dickson is a 27 y.o. male here for evaluation of dental pain onset 1 month ago.  Pain is described as throbbing and achy.  Located to the upper posterior gumline.  Associated with small amount of bleeding yesterday.  Pain is worse with cold drinks and chewing.  Has taken Tylenol and ibuprofen without relief.  He went to walk-in dental clinic and was told that they did not have woken services and was told to come to the ER for antibiotics.  He denies fevers, trismus, drooling, changes in his voice. No alleviating factors.   HPI  History reviewed. No pertinent past medical history.  There are no active problems to display for this patient.   History reviewed. No pertinent surgical history.      Home Medications    Prior to Admission medications   Medication Sig Start Date End Date Taking? Authorizing Provider  clindamycin (CLEOCIN) 150 MG capsule Take 3 capsules (450 mg total) by mouth 3 (three) times daily for 7 days. 07/29/18 08/05/18  Liberty Handy, PA-C  cyclobenzaprine (FLEXERIL) 10 MG tablet Take 1 tablet (10 mg total) by mouth every 8 (eight) hours as needed for muscle spasms. 01/17/16   Beers, Charmayne Sheer, PA-C  ibuprofen (ADVIL,MOTRIN) 800 MG tablet Take 1 tablet (800 mg total) by mouth every 8 (eight) hours as needed for moderate pain. 03/24/17   Irean Hong, MD  meclizine (ANTIVERT) 25 MG tablet Take 1 tablet (25 mg total) by mouth 3 (three) times daily as needed for dizziness or nausea. 09/16/15   Irean Hong, MD  ondansetron (ZOFRAN ODT) 4 MG disintegrating tablet Take 1 tablet (4 mg total) by mouth every 8 (eight) hours as needed for nausea or vomiting. 09/16/15   Irean Hong, MD    Family History History reviewed. No pertinent family  history.  Social History Social History   Tobacco Use  . Smoking status: Former Smoker    Types: Cigarettes  . Smokeless tobacco: Never Used  Substance Use Topics  . Alcohol use: No  . Drug use: No     Allergies   Amoxicillin and Penicillins   Review of Systems Review of Systems  HENT: Positive for dental problem.   All other systems reviewed and are negative.    Physical Exam Updated Vital Signs BP 136/86 (BP Location: Right Arm)   Pulse 87   Temp 97.6 F (36.4 C) (Oral)   Resp 18   SpO2 99%   Physical Exam  Constitutional: He is oriented to person, place, and time. He appears well-developed and well-nourished.  Non-toxic appearance.  HENT:  Head: Normocephalic.  Right Ear: External ear normal.  Left Ear: External ear normal.  Nose: Nose normal.  Tooth #1 has deep cavity centrally, build up of white plaque around base of tooth with mild edema and tenderness. No purulence with pressure. No pain with percussion. No obvious gum line abscess. No trismus. MMM. Oropharynx and tonsils normal. Uvula midline. No SL fullness. Normal protrusion of tongue. No muffled voice. No facial swelling or tenderness.   Eyes: Conjunctivae and EOM are normal.  Neck: Full passive range of motion without pain.  Cardiovascular: Normal rate.  Pulmonary/Chest: Effort normal. No tachypnea. No respiratory distress.  Musculoskeletal: Normal range  of motion.  Neurological: He is alert and oriented to person, place, and time.  Skin: Skin is warm and dry. Capillary refill takes less than 2 seconds.  Psychiatric: His behavior is normal. Thought content normal.     ED Treatments / Results  Labs (all labs ordered are listed, but only abnormal results are displayed) Labs Reviewed - No data to display  EKG None  Radiology No results found.  Procedures Procedures (including critical care time)  Medications Ordered in ED Medications - No data to display   Initial Impression /  Assessment and Plan / ED Course  I have reviewed the triage vital signs and the nursing notes.  Pertinent labs & imaging results that were available during my care of the patient were reviewed by me and considered in my medical decision making (see chart for details).     Dental pain associated with dental cary but no signs or symptoms of dental abscess on exam. likely pain from cavity and exposure of dentin/nerve endings. Afebrile, non toxic appearing, swallowing secretions well without hot potato voice.  No facial swelling. Exam unconcerning for Ludwig's angina or other deep tissue infection in neck. As there is gum swelling with edema, will treat with antibiotic and high dose NSAIDs, oragel. Urged patient to follow-up with dentist.  Patient given dentist contact info, encouraged to follow up in 1-2 days for ultimate management of dental pain and overall dental health. Strict ED return precautions given. Pt is aware of red flag symptoms that would warrant return to ED for re-evaluation and further treatment. Patient voices understanding and is agreeable to plan.  Final Clinical Impressions(s) / ED Diagnoses   Final diagnoses:  Pain, dental    ED Discharge Orders         Ordered    clindamycin (CLEOCIN) 150 MG capsule  3 times daily     07/29/18 1013           Liberty Handy, [place] 07/29/18 1055    Mesner, Barbara Cower, MD 07/29/18 1637

## 2018-07-29 NOTE — Discharge Instructions (Addendum)
You were seen in the ER for dental pain.  I think your pain is from large cavity or broken tooth that is exposing some nerve endings.  I do not see an active source of infection however we will treat you with clindamycin today to prevent superimposed infection before you see the dentist.  Take 600 mg of ibuprofen and/or 500 to 1000 mg of acetaminophen every 6-8 hours for pain.  Apply Orajel prior to eating and drinking.  Avoid cold or hard foods and liquids to prevent further inflammation.  Unfortunately, dental pain will continue until you have a full dental evaluation and treatment.  See dental resources attached.  Additionally, I have given you Dr. Mayford Knife and Dr. Leanord Asal contact information, they frequently try to accommodate patients from the Er.  Return to the ER for fevers greater than 100.41F, facial swelling, pus or bleeding from the gumline.

## 2019-08-08 ENCOUNTER — Other Ambulatory Visit: Payer: Self-pay

## 2019-08-08 ENCOUNTER — Encounter (HOSPITAL_COMMUNITY): Payer: Self-pay | Admitting: Emergency Medicine

## 2019-08-08 ENCOUNTER — Emergency Department (HOSPITAL_COMMUNITY)
Admission: EM | Admit: 2019-08-08 | Discharge: 2019-08-09 | Disposition: A | Discharge status: Home / Self Care | Payer: Self-pay | Attending: Emergency Medicine | Admitting: Emergency Medicine

## 2019-08-08 DIAGNOSIS — Z5321 Procedure and treatment not carried out due to patient leaving prior to being seen by health care provider: Secondary | ICD-10-CM | POA: Insufficient documentation

## 2019-08-08 NOTE — ED Triage Notes (Signed)
Patient reports left upper back pain onset 3 days ago , denies injury , pain increases with movement.

## 2019-08-08 NOTE — ED Notes (Signed)
Patient access gave patient stickers stating patient left
